# Patient Record
Sex: Female | Born: 1935 | Race: White | Hispanic: No | Marital: Married
Health system: Southern US, Community
[De-identification: ages and names within clinical notes are randomized; demographics above are authoritative.]

## PROBLEM LIST (undated history)

## (undated) DIAGNOSIS — C801 Malignant (primary) neoplasm, unspecified: Secondary | ICD-10-CM

---

## 2007-07-16 ENCOUNTER — Ambulatory Visit: Payer: Self-pay | Admitting: Unknown Physician Specialty

## 2008-01-03 ENCOUNTER — Ambulatory Visit: Payer: Self-pay | Admitting: Family Medicine

## 2009-01-12 ENCOUNTER — Ambulatory Visit: Payer: Self-pay | Admitting: Family Medicine

## 2010-02-27 ENCOUNTER — Ambulatory Visit: Payer: Self-pay | Admitting: Family Medicine

## 2011-02-12 ENCOUNTER — Other Ambulatory Visit: Payer: Self-pay | Admitting: Orthopedic Surgery

## 2011-02-12 DIAGNOSIS — M549 Dorsalgia, unspecified: Secondary | ICD-10-CM

## 2011-02-19 ENCOUNTER — Ambulatory Visit
Admission: RE | Admit: 2011-02-19 | Discharge: 2011-02-19 | Disposition: A | Discharge status: Home / Self Care | Payer: PRIVATE HEALTH INSURANCE | Source: Ambulatory Visit | Attending: Orthopedic Surgery | Admitting: Orthopedic Surgery

## 2011-02-19 DIAGNOSIS — M549 Dorsalgia, unspecified: Secondary | ICD-10-CM

## 2011-04-03 ENCOUNTER — Other Ambulatory Visit: Payer: Self-pay | Admitting: Orthopedic Surgery

## 2011-04-03 DIAGNOSIS — S32000A Wedge compression fracture of unspecified lumbar vertebra, initial encounter for closed fracture: Secondary | ICD-10-CM

## 2011-04-03 DIAGNOSIS — IMO0002 Reserved for concepts with insufficient information to code with codable children: Secondary | ICD-10-CM

## 2011-04-08 ENCOUNTER — Ambulatory Visit
Admission: RE | Admit: 2011-04-08 | Discharge: 2011-04-08 | Disposition: A | Discharge status: Home / Self Care | Payer: PRIVATE HEALTH INSURANCE | Source: Ambulatory Visit | Attending: Orthopedic Surgery | Admitting: Orthopedic Surgery

## 2011-04-08 DIAGNOSIS — S32000A Wedge compression fracture of unspecified lumbar vertebra, initial encounter for closed fracture: Secondary | ICD-10-CM

## 2011-04-08 DIAGNOSIS — IMO0002 Reserved for concepts with insufficient information to code with codable children: Secondary | ICD-10-CM

## 2011-04-17 ENCOUNTER — Other Ambulatory Visit: Payer: Self-pay | Admitting: Orthopedic Surgery

## 2011-04-17 DIAGNOSIS — IMO0002 Reserved for concepts with insufficient information to code with codable children: Secondary | ICD-10-CM

## 2011-04-21 ENCOUNTER — Ambulatory Visit
Admission: RE | Admit: 2011-04-21 | Discharge: 2011-04-21 | Disposition: A | Discharge status: Home / Self Care | Payer: PRIVATE HEALTH INSURANCE | Source: Ambulatory Visit | Attending: Orthopedic Surgery | Admitting: Orthopedic Surgery

## 2011-04-21 DIAGNOSIS — IMO0002 Reserved for concepts with insufficient information to code with codable children: Secondary | ICD-10-CM

## 2011-06-04 ENCOUNTER — Ambulatory Visit: Payer: Self-pay | Admitting: Sports Medicine

## 2011-06-05 ENCOUNTER — Other Ambulatory Visit: Payer: Self-pay | Admitting: Sports Medicine

## 2011-06-05 ENCOUNTER — Ambulatory Visit
Admission: RE | Admit: 2011-06-05 | Discharge: 2011-06-05 | Disposition: A | Discharge status: Home / Self Care | Payer: PRIVATE HEALTH INSURANCE | Source: Ambulatory Visit | Attending: Sports Medicine | Admitting: Sports Medicine

## 2011-06-05 DIAGNOSIS — M546 Pain in thoracic spine: Secondary | ICD-10-CM

## 2011-06-05 DIAGNOSIS — IMO0002 Reserved for concepts with insufficient information to code with codable children: Secondary | ICD-10-CM

## 2011-06-06 ENCOUNTER — Other Ambulatory Visit: Payer: Self-pay | Admitting: Sports Medicine

## 2011-06-06 ENCOUNTER — Ambulatory Visit
Admission: RE | Admit: 2011-06-06 | Discharge: 2011-06-06 | Disposition: A | Discharge status: Home / Self Care | Payer: PRIVATE HEALTH INSURANCE | Source: Ambulatory Visit | Attending: Sports Medicine | Admitting: Sports Medicine

## 2011-06-06 DIAGNOSIS — IMO0002 Reserved for concepts with insufficient information to code with codable children: Secondary | ICD-10-CM

## 2011-06-09 ENCOUNTER — Telehealth: Payer: Self-pay | Admitting: Diagnostic Radiology

## 2011-06-16 ENCOUNTER — Telehealth: Payer: Self-pay | Admitting: Diagnostic Radiology

## 2011-07-07 ENCOUNTER — Other Ambulatory Visit (HOSPITAL_COMMUNITY): Payer: Self-pay | Admitting: Orthopedic Surgery

## 2011-07-07 DIAGNOSIS — C50919 Malignant neoplasm of unspecified site of unspecified female breast: Secondary | ICD-10-CM

## 2011-07-14 ENCOUNTER — Encounter (HOSPITAL_COMMUNITY)
Admission: RE | Admit: 2011-07-14 | Discharge: 2011-07-14 | Disposition: A | Discharge status: Home / Self Care | Payer: PRIVATE HEALTH INSURANCE | Source: Ambulatory Visit | Attending: Orthopedic Surgery | Admitting: Orthopedic Surgery

## 2011-07-14 ENCOUNTER — Encounter (HOSPITAL_COMMUNITY): Payer: Self-pay

## 2011-07-14 DIAGNOSIS — C50919 Malignant neoplasm of unspecified site of unspecified female breast: Secondary | ICD-10-CM

## 2011-07-14 HISTORY — DX: Malignant (primary) neoplasm, unspecified: C80.1

## 2011-07-14 MED ORDER — TECHNETIUM TC 99M MEDRONATE IV KIT
23.5000 | PACK | Freq: Once | INTRAVENOUS | Status: AC | PRN
  Administered 2011-07-14: 24 via INTRAVENOUS

## 2014-08-08 ENCOUNTER — Ambulatory Visit: Payer: Self-pay | Admitting: Family Medicine

## 2014-08-17 ENCOUNTER — Ambulatory Visit: Payer: Self-pay | Admitting: Cardiothoracic Surgery

## 2014-08-17 ENCOUNTER — Ambulatory Visit: Payer: Self-pay | Admitting: Oncology

## 2014-08-29 ENCOUNTER — Ambulatory Visit: Payer: Self-pay | Admitting: Oncology

## 2014-10-16 DIAGNOSIS — M549 Dorsalgia, unspecified: Secondary | ICD-10-CM | POA: Diagnosis not present

## 2014-10-16 DIAGNOSIS — R918 Other nonspecific abnormal finding of lung field: Secondary | ICD-10-CM

## 2014-10-16 DIAGNOSIS — F419 Anxiety disorder, unspecified: Secondary | ICD-10-CM

## 2014-10-16 DIAGNOSIS — K219 Gastro-esophageal reflux disease without esophagitis: Secondary | ICD-10-CM | POA: Diagnosis not present

## 2014-10-27 ENCOUNTER — Telehealth: Payer: Self-pay

## 2014-10-27 NOTE — Telephone Encounter (Signed)
Richardson Landry pts son left v/m; pt's pain levels are increasing and Richardson Landry wants to know if could increase pain medication. Richardson Landry said pt is in hospice and call next week would be OK.

## 2014-10-28 NOTE — Telephone Encounter (Signed)
I will reevaluate her on Monday at [facility] and then call son

## 2014-11-03 DIAGNOSIS — M25521 Pain in right elbow: Secondary | ICD-10-CM

## 2014-11-08 DIAGNOSIS — M545 Low back pain: Secondary | ICD-10-CM

## 2014-11-08 DIAGNOSIS — K219 Gastro-esophageal reflux disease without esophagitis: Secondary | ICD-10-CM | POA: Diagnosis not present

## 2014-11-08 DIAGNOSIS — F419 Anxiety disorder, unspecified: Secondary | ICD-10-CM

## 2014-11-08 DIAGNOSIS — M25529 Pain in unspecified elbow: Secondary | ICD-10-CM | POA: Diagnosis not present

## 2014-11-08 DIAGNOSIS — R911 Solitary pulmonary nodule: Secondary | ICD-10-CM | POA: Diagnosis not present

## 2014-12-07 DIAGNOSIS — K219 Gastro-esophageal reflux disease without esophagitis: Secondary | ICD-10-CM | POA: Diagnosis not present

## 2014-12-07 DIAGNOSIS — F419 Anxiety disorder, unspecified: Secondary | ICD-10-CM | POA: Diagnosis not present

## 2014-12-07 DIAGNOSIS — M545 Low back pain: Secondary | ICD-10-CM | POA: Diagnosis not present

## 2014-12-07 DIAGNOSIS — R918 Other nonspecific abnormal finding of lung field: Secondary | ICD-10-CM | POA: Diagnosis not present

## 2015-01-11 DIAGNOSIS — J411 Mucopurulent chronic bronchitis: Secondary | ICD-10-CM | POA: Diagnosis not present

## 2015-01-11 DIAGNOSIS — R918 Other nonspecific abnormal finding of lung field: Secondary | ICD-10-CM | POA: Diagnosis not present

## 2015-01-11 DIAGNOSIS — S50311D Abrasion of right elbow, subsequent encounter: Secondary | ICD-10-CM | POA: Diagnosis not present

## 2015-01-24 DIAGNOSIS — M545 Low back pain: Secondary | ICD-10-CM | POA: Diagnosis not present

## 2015-01-24 DIAGNOSIS — F419 Anxiety disorder, unspecified: Secondary | ICD-10-CM | POA: Diagnosis not present

## 2015-01-24 DIAGNOSIS — K219 Gastro-esophageal reflux disease without esophagitis: Secondary | ICD-10-CM

## 2015-01-24 DIAGNOSIS — R918 Other nonspecific abnormal finding of lung field: Secondary | ICD-10-CM | POA: Diagnosis not present

## 2015-02-05 DIAGNOSIS — M79602 Pain in left arm: Secondary | ICD-10-CM | POA: Diagnosis not present

## 2015-02-07 ENCOUNTER — Ambulatory Visit: Payer: PRIVATE HEALTH INSURANCE | Admitting: Internal Medicine

## 2015-02-08 DIAGNOSIS — M7021 Olecranon bursitis, right elbow: Secondary | ICD-10-CM

## 2015-02-17 ENCOUNTER — Emergency Department: Payer: Self-pay | Admitting: Emergency Medicine

## 2015-02-19 ENCOUNTER — Telehealth: Payer: Self-pay

## 2015-02-19 NOTE — Telephone Encounter (Signed)
PLEASE NOTE: All timestamps contained within this report are represented as [place] Standard Time. CONFIDENTIALTY NOTICE: This fax transmission is intended only for the addressee. It contains information that is legally privileged, confidential or otherwise protected from use or disclosure. If you are not the intended recipient, you are strictly prohibited from reviewing, disclosing, copying using or disseminating any of this information or taking any action in reliance on or regarding this information. If you have received this fax in error, please notify us immediately by telephone so that we can arrange for its return to [place]. Phone: [phone], Toll-Free: [phone], Fax: [phone] Page: 1 of 1 Call Id: [id number] [place] Patient Name: Erin Atkins Gender: Female DOB: 10-Dec-1925 Age: 81 Y 11 M Return Phone Number: Address: City/State/Zip: [place] Client [place] Night - Client Client Site [place] Physician Viviana Simpler Contact Type Call Call Type Page Only Caller Name Bonnita Nasuti Relationship To Patient Provider Is this call to report lab results? No Return Phone Number Unavailable Initial Comment Caller states Bonnita Nasuti with [facility] [place]. States that patient fell out of bed and hit right forehead. Bleeding and needs stitches. cb# [phone] Nurse Assessment Guidelines Guideline Title Affirmed Question Affirmed Notes Nurse Date/Time Eilene Ghazi Time) Disp. Time Eilene Ghazi Time) Disposition Final User 02/16/2015 11:48:20 PM Send to [place], [place] 02/16/2015 11:52:43 PM Paged On Call back to Call [place], [place] 02/17/2015 12:11:38 AM [facility] Provider Gae Gallop 02/17/2015 12:11:26 AM Page Completed Yes Gae Gallop After Care Instructions Given Call Event Type User Date / Time  Description Paging DoctorName DoctorPhone DateTime Result/Outcome Notes Shanon Ace [phone] 02/16/2015 11:52:43 PM Called On Call Provider - Left Message Shanon Ace [phone] 02/17/2015 12:11:38 AM Called On Call Provider - Reached Shanon Ace 02/17/2015 12:11:42 AM Spoke with On Call - General

## 2015-02-19 NOTE — Telephone Encounter (Signed)
Treated at ED and sent back Stable now Head CT negative

## 2015-03-01 DIAGNOSIS — M549 Dorsalgia, unspecified: Secondary | ICD-10-CM | POA: Diagnosis not present

## 2015-03-01 DIAGNOSIS — F419 Anxiety disorder, unspecified: Secondary | ICD-10-CM | POA: Diagnosis not present

## 2015-03-01 DIAGNOSIS — R918 Other nonspecific abnormal finding of lung field: Secondary | ICD-10-CM

## 2015-03-01 DIAGNOSIS — K219 Gastro-esophageal reflux disease without esophagitis: Secondary | ICD-10-CM

## 2015-03-20 DIAGNOSIS — I480 Paroxysmal atrial fibrillation: Secondary | ICD-10-CM

## 2015-03-20 DIAGNOSIS — R001 Bradycardia, unspecified: Secondary | ICD-10-CM | POA: Diagnosis not present

## 2015-05-01 DIAGNOSIS — J449 Chronic obstructive pulmonary disease, unspecified: Secondary | ICD-10-CM | POA: Diagnosis not present

## 2015-05-01 DIAGNOSIS — F411 Generalized anxiety disorder: Secondary | ICD-10-CM | POA: Diagnosis not present

## 2015-05-01 DIAGNOSIS — R918 Other nonspecific abnormal finding of lung field: Secondary | ICD-10-CM

## 2015-05-01 DIAGNOSIS — S50311S Abrasion of right elbow, sequela: Secondary | ICD-10-CM | POA: Diagnosis not present

## 2015-05-11 DIAGNOSIS — M545 Low back pain: Secondary | ICD-10-CM | POA: Diagnosis not present

## 2015-05-11 DIAGNOSIS — F419 Anxiety disorder, unspecified: Secondary | ICD-10-CM | POA: Diagnosis not present

## 2015-05-11 DIAGNOSIS — R918 Other nonspecific abnormal finding of lung field: Secondary | ICD-10-CM | POA: Diagnosis not present

## 2015-05-11 DIAGNOSIS — K219 Gastro-esophageal reflux disease without esophagitis: Secondary | ICD-10-CM | POA: Diagnosis not present

## 2015-06-13 DIAGNOSIS — J449 Chronic obstructive pulmonary disease, unspecified: Secondary | ICD-10-CM

## 2015-06-13 DIAGNOSIS — F411 Generalized anxiety disorder: Secondary | ICD-10-CM | POA: Diagnosis not present

## 2015-06-13 DIAGNOSIS — S50311D Abrasion of right elbow, subsequent encounter: Secondary | ICD-10-CM

## 2015-06-13 DIAGNOSIS — R918 Other nonspecific abnormal finding of lung field: Secondary | ICD-10-CM

## 2015-07-05 IMAGING — CT CT HEAD WITHOUT CONTRAST
3 series · 16 of 30 positions shown, 17 images · non-contrast
Comparison: None.

CLINICAL DATA: Unwitnessed fall with laceration to the right temple

EXAM:
CT HEAD WITHOUT CONTRAST
TECHNIQUE: Contiguous axial images were obtained from the base of the skull
through the vertex without intravenous contrast.

[Series 2: head bone · axial · 0.38mm/px · z∈[-184,-44]mm · 8 of 86 slices shown]
[im 11/86  bone]
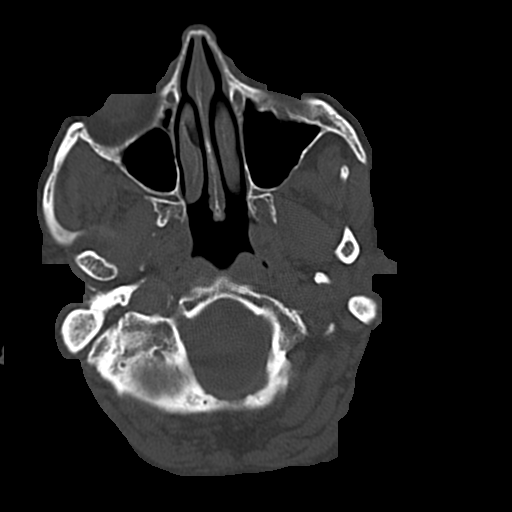
[im 21/86  bone]
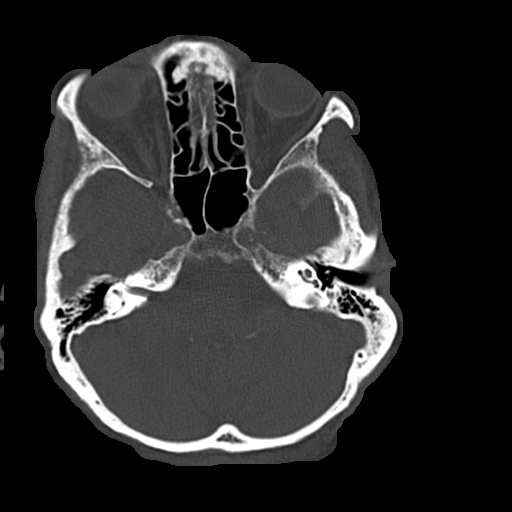
[im 31/86  bone]
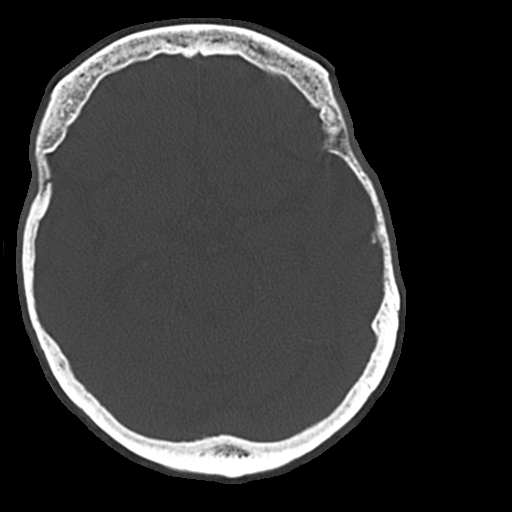
[im 41/86  bone]
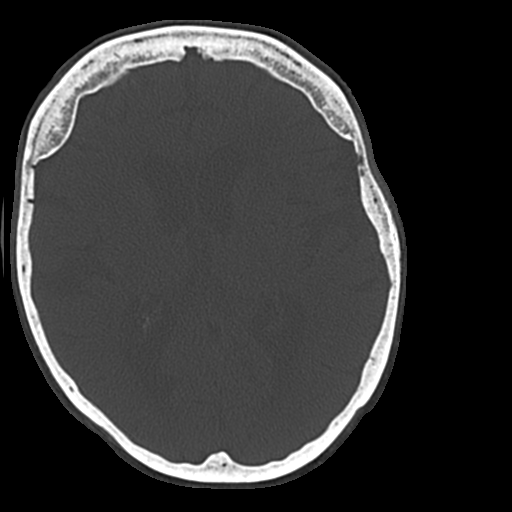
[im 51/86  bone]
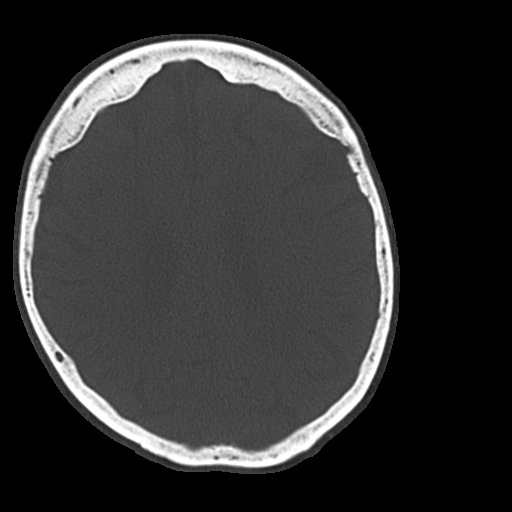
[im 61/86  bone]
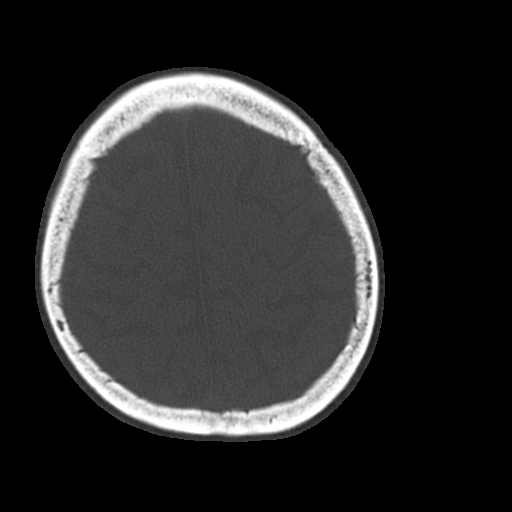
[im 71/86  bone]
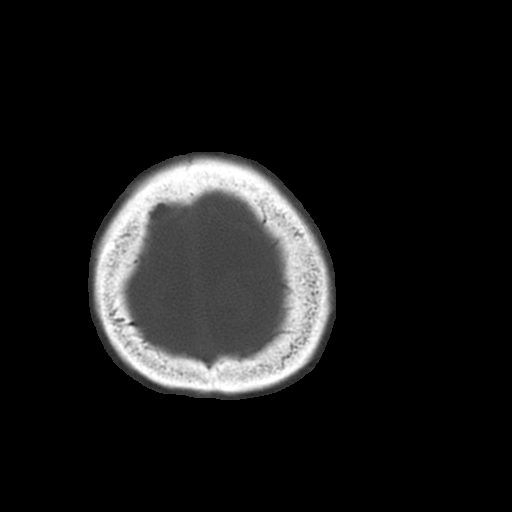
[im 81/86  bone]
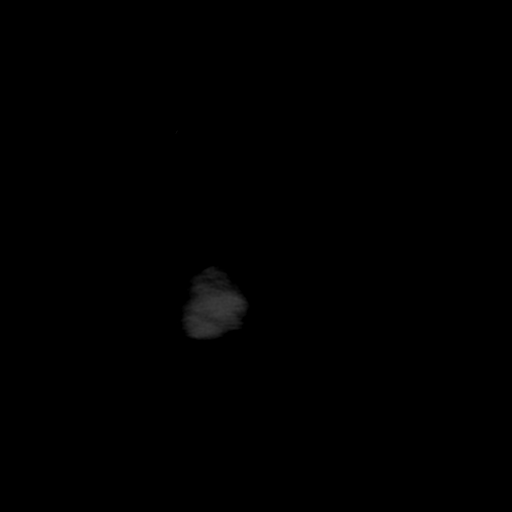

[Series 3: head wo · axial · 0.38mm/px · z∈[-164,-74]mm · 4 of 31 slices shown, 5 images]
[im 7/31  brain]
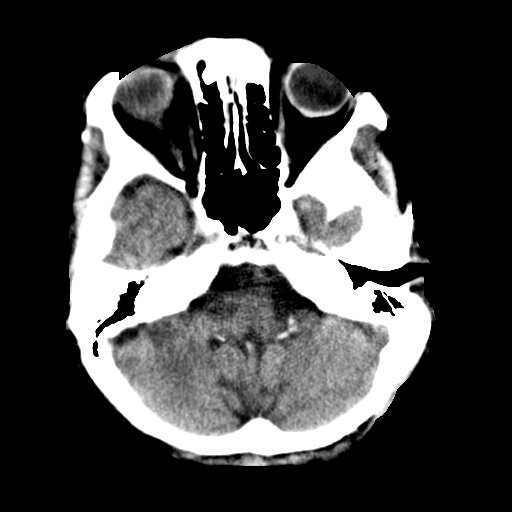
[im 7/31  bone]
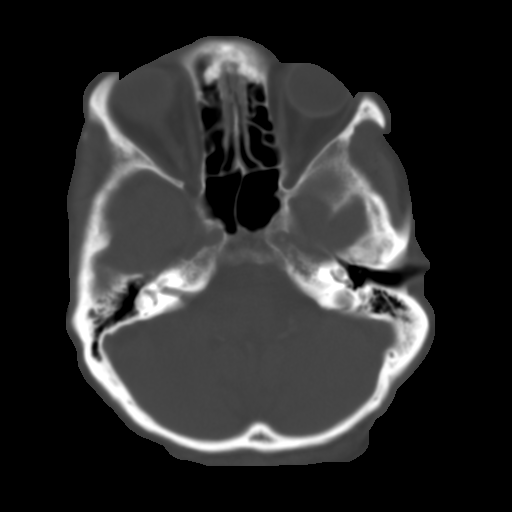
[im 13/31  brain]
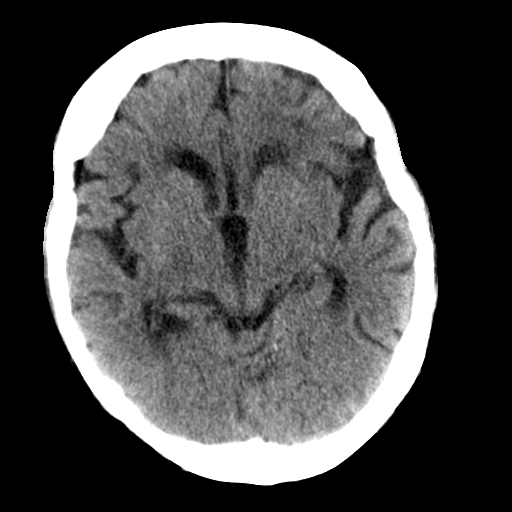
[im 19/31  brain]
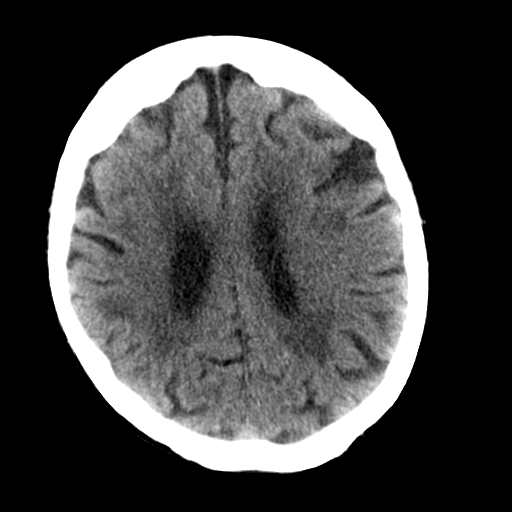
[im 25/31  brain]
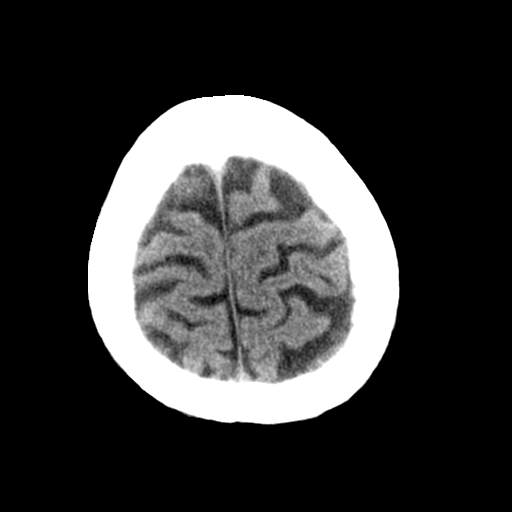

[Series 5: head wo recons · axial · 0.38mm/px · z∈[-134,-62]mm · 4 of 27 slices shown]
[im 6/27  brain]
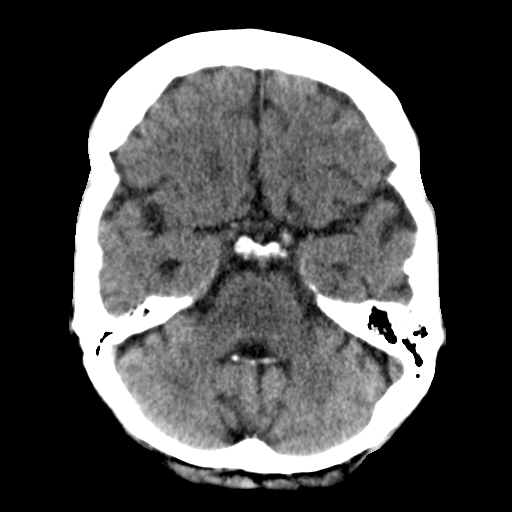
[im 11/27  brain]
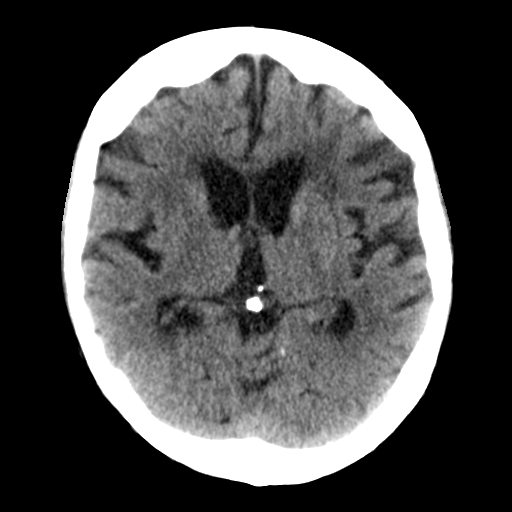
[im 16/27  brain]
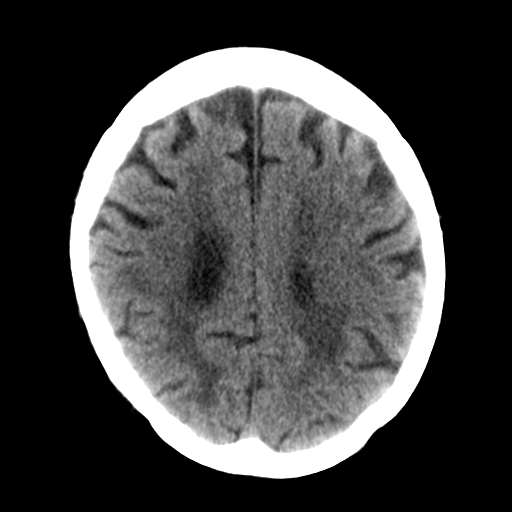
[im 21/27  brain]
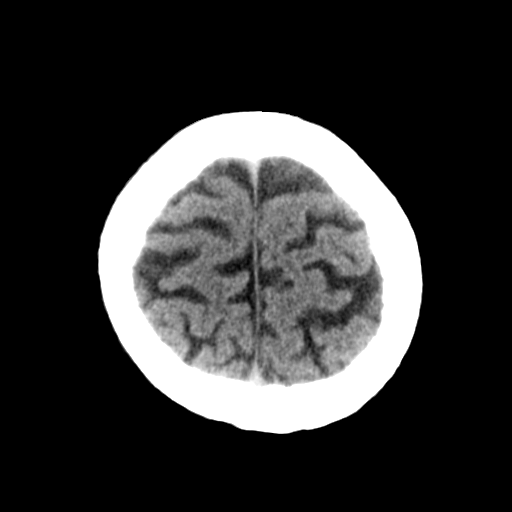

[16 of 30 positions shown; findings below may reference images not displayed]

FINDINGS: There is no intracranial hemorrhage, mass or evidence of acute
infarction. There is moderate generalized atrophy. There is moderate
chronic microvascular ischemic change. There is no significant
extra-axial fluid collection.

No acute intracranial findings are evident.
IMPRESSION: Atrophy and moderately severe chronic microvascular ischemic
disease. No acute findings.

## 2015-07-09 DIAGNOSIS — F29 Unspecified psychosis not due to a substance or known physiological condition: Secondary | ICD-10-CM | POA: Diagnosis not present

## 2015-07-09 DIAGNOSIS — E441 Mild protein-calorie malnutrition: Secondary | ICD-10-CM | POA: Diagnosis not present

## 2015-07-09 DIAGNOSIS — R918 Other nonspecific abnormal finding of lung field: Secondary | ICD-10-CM | POA: Diagnosis not present

## 2015-07-09 DIAGNOSIS — F411 Generalized anxiety disorder: Secondary | ICD-10-CM

## 2015-07-09 DIAGNOSIS — I481 Persistent atrial fibrillation: Secondary | ICD-10-CM | POA: Diagnosis not present

## 2015-08-07 DIAGNOSIS — J449 Chronic obstructive pulmonary disease, unspecified: Secondary | ICD-10-CM | POA: Diagnosis not present

## 2015-08-07 DIAGNOSIS — R918 Other nonspecific abnormal finding of lung field: Secondary | ICD-10-CM | POA: Diagnosis not present

## 2015-08-07 DIAGNOSIS — F411 Generalized anxiety disorder: Secondary | ICD-10-CM | POA: Diagnosis not present

## 2015-08-07 DIAGNOSIS — S50311D Abrasion of right elbow, subsequent encounter: Secondary | ICD-10-CM | POA: Diagnosis not present

## 2015-08-08 DIAGNOSIS — J449 Chronic obstructive pulmonary disease, unspecified: Secondary | ICD-10-CM | POA: Diagnosis not present

## 2015-08-08 DIAGNOSIS — S50311D Abrasion of right elbow, subsequent encounter: Secondary | ICD-10-CM | POA: Diagnosis not present

## 2015-08-08 DIAGNOSIS — R918 Other nonspecific abnormal finding of lung field: Secondary | ICD-10-CM | POA: Diagnosis not present

## 2015-08-08 DIAGNOSIS — F411 Generalized anxiety disorder: Secondary | ICD-10-CM | POA: Diagnosis not present

## 2015-09-05 DIAGNOSIS — M545 Low back pain: Secondary | ICD-10-CM

## 2015-09-05 DIAGNOSIS — F419 Anxiety disorder, unspecified: Secondary | ICD-10-CM

## 2015-09-05 DIAGNOSIS — F22 Delusional disorders: Secondary | ICD-10-CM | POA: Diagnosis not present

## 2015-09-05 DIAGNOSIS — R918 Other nonspecific abnormal finding of lung field: Secondary | ICD-10-CM | POA: Diagnosis not present

## 2015-09-05 DIAGNOSIS — E43 Unspecified severe protein-calorie malnutrition: Secondary | ICD-10-CM | POA: Diagnosis not present

## 2015-09-05 DIAGNOSIS — I4891 Unspecified atrial fibrillation: Secondary | ICD-10-CM | POA: Diagnosis not present

## 2015-09-17 ENCOUNTER — Telehealth: Payer: Self-pay

## 2015-09-17 NOTE — Telephone Encounter (Signed)
Husband has dementia. No issues noted when I was there this morning

## 2015-09-17 NOTE — Telephone Encounter (Signed)
PLEASE NOTE: All timestamps contained within this report are represented as [place] Standard Time. CONFIDENTIALTY NOTICE: This fax transmission is intended only for the addressee. It contains information that is legally privileged, confidential or otherwise protected from use or disclosure. If you are not the intended recipient, you are strictly prohibited from reviewing, disclosing, copying using or disseminating any of this information or taking any action in reliance on or regarding this information. If you have received this fax in error, please notify us immediately by telephone so that we can arrange for its return to [place]. Phone: [phone], Toll-Free: [phone], Fax: [phone] Page: 1 of 1 Call Id: [id number] [place] Patient Name: Erin Atkins Gender: Female DOB: (approximate) Age: Return Phone Number: [phone] (Primary) Address: City/State/Zip: [place] Client [place] Night - Client Client Site [place] Physician Viviana Simpler Contact Type Call Call Type Triage / Clinical Caller Name Shanon Brow Relationship To Patient Spouse Return Phone Number [phone] (Primary) Chief Complaint UNCONSCIOUS - can't wake someone up Initial Comment Caller states his wife is is unconscious and she is not able to wake up. Nurse Assessment Nurse: Luther Parody, RN, Malachy Mood Date/Time (Eastern Time): 09/15/2015 12:15:16 PM Confirm and document reason for call. If symptomatic, describe symptoms. ---Caller states that his wife is unconscious and he cannot wake her but that the nurse is there giving her a bath. Caller seemed a little confused and unable to answer questions. I asked if the person in the bathing the pt was a nurse or an aide to which he replied after asking "an aide". I spoke with the aide that advised that both the pt and caller are  in the same room together at [facility] and that the caller has dementia. She is an aide with hospice in to bath the pt. States that she is awake now and getting ready to eat. Has the patient traveled out of the country within the last 30 days? ---Not Applicable Does the patient require triage? ---No Please document clinical information provided and list any resource used. ---Advised aide to please alert the nurse in charge that pt is calling about his wife. Verbalized understanding. Guidelines Guideline Title Affirmed Question Affirmed Notes Nurse Date/Time (Eastern Time) Disp. Time Eilene Ghazi Time) Disposition Final User 09/15/2015 12:13:35 PM Send to Urgent Queue Mosetta Pigeon 09/15/2015 12:24:16 PM Clinical Call Yes Luther Parody, RN, Malachy Mood After Care Instructions Given Call Event Type User Date / Time Description

## 2015-10-30 DEATH — deceased
